# Patient Record
Sex: Female | Born: 1935 | Race: White | Hispanic: No | Smoking: Never smoker
Health system: Southern US, Community
[De-identification: ages and names within clinical notes are randomized; demographics above are authoritative.]

## PROBLEM LIST (undated history)

## (undated) DIAGNOSIS — N183 Chronic kidney disease, stage 3 unspecified: Secondary | ICD-10-CM

## (undated) DIAGNOSIS — K7290 Hepatic failure, unspecified without coma: Secondary | ICD-10-CM

## (undated) DIAGNOSIS — K7460 Unspecified cirrhosis of liver: Secondary | ICD-10-CM

## (undated) DIAGNOSIS — R188 Other ascites: Secondary | ICD-10-CM

## (undated) DIAGNOSIS — Z8719 Personal history of other diseases of the digestive system: Secondary | ICD-10-CM

## (undated) DIAGNOSIS — K7581 Nonalcoholic steatohepatitis (NASH): Secondary | ICD-10-CM

## (undated) DIAGNOSIS — K7682 Hepatic encephalopathy: Secondary | ICD-10-CM

## (undated) HISTORY — DX: Hepatic failure, unspecified without coma: K72.90

## (undated) HISTORY — DX: Chronic kidney disease, stage 3 (moderate): N18.3

## (undated) HISTORY — DX: Personal history of other diseases of the digestive system: Z87.19

## (undated) HISTORY — DX: Unspecified cirrhosis of liver: K74.60

## (undated) HISTORY — PX: ABDOMINAL HYSTERECTOMY: SHX81

## (undated) HISTORY — PX: CHOLECYSTECTOMY: SHX55

## (undated) HISTORY — PX: APPENDECTOMY: SHX54

## (undated) HISTORY — DX: Nonalcoholic steatohepatitis (NASH): K75.81

## (undated) HISTORY — PX: OTHER SURGICAL HISTORY: SHX169

## (undated) HISTORY — DX: Chronic kidney disease, stage 3 unspecified: N18.30

## (undated) HISTORY — DX: Other ascites: R18.8

## (undated) HISTORY — DX: Hepatic encephalopathy: K76.82

---

## 2017-12-01 ENCOUNTER — Encounter: Payer: Self-pay | Admitting: Gastroenterology

## 2018-01-02 ENCOUNTER — Encounter: Payer: Self-pay | Admitting: Gastroenterology

## 2018-01-03 ENCOUNTER — Ambulatory Visit (INDEPENDENT_AMBULATORY_CARE_PROVIDER_SITE_OTHER): Payer: Medicare HMO | Admitting: Gastroenterology

## 2018-01-03 ENCOUNTER — Encounter: Payer: Self-pay | Admitting: Gastroenterology

## 2018-01-03 VITALS — BP 124/68 | HR 76 | Ht 65.5 in | Wt 213.0 lb

## 2018-01-03 DIAGNOSIS — I10 Essential (primary) hypertension: Secondary | ICD-10-CM | POA: Diagnosis not present

## 2018-01-03 DIAGNOSIS — K7460 Unspecified cirrhosis of liver: Secondary | ICD-10-CM

## 2018-01-03 NOTE — Patient Instructions (Addendum)
You have been given your lab orders to take to your PCP to have drawn.  You have been given an order for your abdominal ultrasound to take to [place] will need to be on a strict 2 liters a day of fluids.  Please follow up with Dr. Lyndel Safe in 3 months.

## 2018-01-03 NOTE — Progress Notes (Signed)
Chief Complaint: Follow-up  Referring Provider:  Dr Garlon Hatchet      ASSESSMENT AND PLAN;   #1. 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  Patient does not a candidate for liver transplantation. #2.  Hepatic encephalopathy. #3.  Upper GI bleeding due to esophageal varices status post EVL 02/2014.  EGD 07/2014: Small esophageal varices-too small for aVL, portal hypertensive gastropathy, minimal fundal varices and Candida esophagitis. #4.  Liver lesions (4.2 cm in the right lobe, 1.5 cm in the left lobe with early washout on MRI 12/31/2016 annual ultrasound with normal alpha-fetoprotein.  These were highly suggestive of [place]).  Patient was seen at [facility] and it was recommended to get a repeat MRI in 3 months. Pt wants to hold off on MRI today.  Plan: - Fluid restriction and salt restriction.  Monitor weight. - Rifaxamin 550mg  po bid to continue. - Lasix 40mg  po bid to continue - Spiralolactone 50mg  po qd to continue. - Rpt US abdomen - I have offered repeat MRI but the patient would like to hold off.  She does understand the risks of missing or delaying diagnoses for [place] - Carvidol 12.5mg  bid to continue. - Check AFP, ammonia and PT. She would like to get it done at Dr. Arna Medici office. She was given written orders. - Follow-up in 3 months.  Earlier, if she has any new problems.  HPI:    Katie Copeland is a 84 y.o. female  For follow-up visit. Denies having any significant GI complaints. Has gained over 8 pounds since the last visit. Has not been compliant with fluid restriction.  She has been consuming significant ice as her mouth becomes dry. Had some swelling in the legs. Does take medications as prescribed. She called Dr. Crisoforo Oxford office.  They told her to follow-up here. No  shortness of breath No melena or hematochezia.  Does not want repeat MRI of the liver as " I am still paying for the previous MRI and it was expensive".   Past Medical History:  Diagnosis Date  . Ascites   . CKD (chronic kidney disease) stage 3, GFR 30-59 ml/min (HCC)   . Hepatic encephalopathy ([place])   . History of GI bleed   . Liver cirrhosis secondary to NASH [facility])     Past Surgical History:  Procedure Laterality Date  . ABDOMINAL HYSTERECTOMY    . APPENDECTOMY    . Catheter ablation    . CHOLECYSTECTOMY    . s/p ganglion cyst removal    . s/p heel spur removal      Family History  Problem Relation Age of Onset  . Breast cancer Mother   . Colon cancer Neg Hx     Social History   Tobacco Use  . Smoking status: Never Smoker  . Smokeless tobacco: Never Used  Substance Use Topics  . Alcohol use: Never    Frequency: Never  . Drug use: Never    Current Outpatient Medications  Medication Sig Dispense Refill  . carvedilol (COREG) 12.5 MG tablet Take 12.5 mg by mouth 2 (two) times daily with a meal.    . furosemide (LASIX) 20 MG tablet Take 20 mg by mouth 2 (two) times daily.    Marland Kitchen glimepiride (AMARYL) 2 MG tablet Take 2 mg by mouth daily with breakfast.    . rifaximin (XIFAXAN) 550 MG TABS  tablet Take 550 mg by mouth 2 (two) times daily.    Marland Kitchen spironolactone (ALDACTONE) 50 MG tablet Take 50 mg by mouth daily.     No current facility-administered medications for this visit.     Allergies  Allergen Reactions  . Codeine     Neurologic changes    Review of Systems:   Review of Systems: General: Denies any fever, chills, night sweats, anorexia.  Has fatigue and generalized weakness. Skin: Denies rash, itching, dry skin, hives, moles, warts, or unhealing ulcers.  HEENT: Not present - nasal congestion, sinus pain, hoarseness and sore throat. Respiratory: Denies dyspnea at rest, dyspnea with exercise, cough, sputum, wheezing, coughing up blood. Cardiovascular: Denies  any chest pain, angina, palpitations, syncope, orthopnea, PND, peripheral edema, and claudication. GU : Denies urinary burning, blood in urine, urinary frequency, urinary hesitancy, nocturnal urination, and urinary incontinence. Musculoskeletal: denies joint pain, extremity pain. Denies muscle weakness, cramps, atrophy.  Neurological:  Denies any headaches, dizziness, paresthesias and seizures. Psych: Has anxiety and depression. Heme: Has easy bruisability.     Physical Exam:    BP 124/68   Pulse 76   Ht 5' 5.5" (1.664 m)   Wt 213 lb (96.6 kg)   BMI 34.91 kg/m  Filed Weights   01/03/18 1117  Weight: 213 lb (96.6 kg)   Constitutional:  Well-developed, in no acute distress. Psychiatric: Normal mood and affect. Behavior is normal. HEENT: Pupils normal.  Conjunctivae are normal. No scleral icterus. Neck supple.  Cardiovascular: Normal rate, regular rhythm. No edema Pulmonary/chest: Effort normal and breath sounds normal. No wheezing, rales or rhonchi. Abdominal: Soft, nondistended. Nontender. Bowel sounds active throughout. There are no masses palpable. No hepatomegaly. Rectal:  defered Neurological: Alert and oriented to person place and time. Skin: Skin is warm and dry. No rashes noted. Extremities: 2+ edema.    Carmell Austria, MD 01/03/2018, 11:36 AM  Cc: Dr. Garlon Hatchet

## 2018-02-27 ENCOUNTER — Telehealth: Payer: Self-pay

## 2018-02-27 DIAGNOSIS — K7460 Unspecified cirrhosis of liver: Secondary | ICD-10-CM

## 2018-02-27 NOTE — Telephone Encounter (Signed)
Patient scheduled for Ultrasound of the Abdomen at [facility] on August 26 th at [facility] . Patient notified by phone.

## 2018-03-06 ENCOUNTER — Other Ambulatory Visit (HOSPITAL_BASED_OUTPATIENT_CLINIC_OR_DEPARTMENT_OTHER): Payer: Medicare HMO

## 2018-03-06 ENCOUNTER — Ambulatory Visit (HOSPITAL_BASED_OUTPATIENT_CLINIC_OR_DEPARTMENT_OTHER)
Admission: RE | Admit: 2018-03-06 | Discharge: 2018-03-06 | Disposition: A | Discharge status: Home / Self Care | Payer: Medicare HMO | Source: Ambulatory Visit | Attending: Gastroenterology | Admitting: Gastroenterology

## 2018-03-06 ENCOUNTER — Other Ambulatory Visit (INDEPENDENT_AMBULATORY_CARE_PROVIDER_SITE_OTHER): Payer: Medicare HMO

## 2018-03-06 DIAGNOSIS — I10 Essential (primary) hypertension: Secondary | ICD-10-CM

## 2018-03-06 DIAGNOSIS — K7460 Unspecified cirrhosis of liver: Secondary | ICD-10-CM | POA: Diagnosis present

## 2018-03-06 LAB — PROTIME-INR
INR: 1.3 ratio — AB (ref 0.8–1.0)
PROTHROMBIN TIME: 15.2 s — AB (ref 9.6–13.1)

## 2018-03-06 LAB — AMMONIA: Ammonia: 76 umol/L — ABNORMAL HIGH (ref 11–35)

## 2018-03-07 ENCOUNTER — Telehealth: Payer: Self-pay | Admitting: Gastroenterology

## 2018-03-07 ENCOUNTER — Telehealth: Payer: Self-pay

## 2018-03-07 LAB — AFP TUMOR MARKER: AFP-Tumor Marker: 4.5 ng/mL

## 2018-03-07 NOTE — Telephone Encounter (Signed)
I spoke with the patient and gave her the results of the ultrasound.  She did have an AFP done and the result is on the chart.  Patient states she recently had a MRI done at [facility] and I will try to get that report.  She would like to speak with you when you are back in the office next week

## 2018-03-07 NOTE — Telephone Encounter (Signed)
I received a copy of the MRI from [facility] and it was sent to [facility] by courier

## 2018-03-09 ENCOUNTER — Telehealth: Payer: Self-pay | Admitting: Gastroenterology

## 2018-03-09 NOTE — Telephone Encounter (Signed)
Ultrasound and labs were routed to her PCP.

## 2018-03-16 DIAGNOSIS — R112 Nausea with vomiting, unspecified: Secondary | ICD-10-CM

## 2018-03-16 DIAGNOSIS — D696 Thrombocytopenia, unspecified: Secondary | ICD-10-CM

## 2018-03-16 DIAGNOSIS — K7460 Unspecified cirrhosis of liver: Secondary | ICD-10-CM

## 2018-03-16 DIAGNOSIS — N390 Urinary tract infection, site not specified: Secondary | ICD-10-CM | POA: Diagnosis not present

## 2018-03-16 DIAGNOSIS — Q453 Other congenital malformations of pancreas and pancreatic duct: Secondary | ICD-10-CM

## 2018-03-16 DIAGNOSIS — I81 Portal vein thrombosis: Secondary | ICD-10-CM

## 2018-03-16 DIAGNOSIS — R160 Hepatomegaly, not elsewhere classified: Secondary | ICD-10-CM | POA: Diagnosis not present

## 2018-03-16 DIAGNOSIS — I10 Essential (primary) hypertension: Secondary | ICD-10-CM

## 2018-03-17 DIAGNOSIS — K7460 Unspecified cirrhosis of liver: Secondary | ICD-10-CM

## 2018-03-17 DIAGNOSIS — D689 Coagulation defect, unspecified: Secondary | ICD-10-CM

## 2018-03-17 DIAGNOSIS — N189 Chronic kidney disease, unspecified: Secondary | ICD-10-CM

## 2018-03-17 DIAGNOSIS — N390 Urinary tract infection, site not specified: Secondary | ICD-10-CM

## 2018-03-17 DIAGNOSIS — C220 Liver cell carcinoma: Secondary | ICD-10-CM

## 2018-03-17 DIAGNOSIS — I81 Portal vein thrombosis: Secondary | ICD-10-CM | POA: Diagnosis not present

## 2018-03-17 DIAGNOSIS — D696 Thrombocytopenia, unspecified: Secondary | ICD-10-CM

## 2018-03-17 DIAGNOSIS — D6489 Other specified anemias: Secondary | ICD-10-CM | POA: Diagnosis not present

## 2018-03-17 DIAGNOSIS — R160 Hepatomegaly, not elsewhere classified: Secondary | ICD-10-CM | POA: Diagnosis not present

## 2018-03-17 DIAGNOSIS — R161 Splenomegaly, not elsewhere classified: Secondary | ICD-10-CM

## 2018-03-18 DIAGNOSIS — N390 Urinary tract infection, site not specified: Secondary | ICD-10-CM | POA: Diagnosis not present

## 2018-03-18 DIAGNOSIS — I81 Portal vein thrombosis: Secondary | ICD-10-CM | POA: Diagnosis not present

## 2018-03-18 DIAGNOSIS — R160 Hepatomegaly, not elsewhere classified: Secondary | ICD-10-CM | POA: Diagnosis not present

## 2018-03-18 DIAGNOSIS — D696 Thrombocytopenia, unspecified: Secondary | ICD-10-CM | POA: Diagnosis not present

## 2018-03-20 ENCOUNTER — Telehealth: Payer: Self-pay | Admitting: Gastroenterology

## 2018-03-20 NOTE — Telephone Encounter (Signed)
Pt returning phone call to Dr.Gupta best call back [phone].

## 2018-03-20 NOTE — Telephone Encounter (Signed)
Called again Left message on the voicemail

## 2018-03-20 NOTE — Telephone Encounter (Signed)
I talked to the patient in detail Had an MRI done at [facility] a few days ago. Also has appointment with Dr. Hinton Rao for September 10 at [place], can you please get the MRI report from [facility]

## 2018-03-21 NOTE — Telephone Encounter (Signed)
Called and requested MRI report be faxed to [phone], also faxed request to [phone].

## 2018-03-21 NOTE — Telephone Encounter (Signed)
MRI report faxed to Dr. Lyndel Safe.

## 2018-03-22 DIAGNOSIS — I81 Portal vein thrombosis: Secondary | ICD-10-CM

## 2018-03-22 DIAGNOSIS — K7460 Unspecified cirrhosis of liver: Secondary | ICD-10-CM

## 2018-03-22 DIAGNOSIS — C220 Liver cell carcinoma: Secondary | ICD-10-CM | POA: Diagnosis not present

## 2018-03-22 DIAGNOSIS — R161 Splenomegaly, not elsewhere classified: Secondary | ICD-10-CM

## 2018-03-22 DIAGNOSIS — R188 Other ascites: Secondary | ICD-10-CM

## 2018-03-22 DIAGNOSIS — E876 Hypokalemia: Secondary | ICD-10-CM

## 2018-03-22 DIAGNOSIS — I8500 Esophageal varices without bleeding: Secondary | ICD-10-CM

## 2018-03-22 DIAGNOSIS — D638 Anemia in other chronic diseases classified elsewhere: Secondary | ICD-10-CM

## 2018-03-22 DIAGNOSIS — R160 Hepatomegaly, not elsewhere classified: Secondary | ICD-10-CM

## 2018-03-22 DIAGNOSIS — D6959 Other secondary thrombocytopenia: Secondary | ICD-10-CM

## 2018-05-07 DIAGNOSIS — J811 Chronic pulmonary edema: Secondary | ICD-10-CM | POA: Diagnosis not present

## 2018-05-07 DIAGNOSIS — J9601 Acute respiratory failure with hypoxia: Secondary | ICD-10-CM

## 2018-05-07 DIAGNOSIS — R0600 Dyspnea, unspecified: Secondary | ICD-10-CM | POA: Diagnosis not present

## 2018-05-08 DIAGNOSIS — J811 Chronic pulmonary edema: Secondary | ICD-10-CM | POA: Diagnosis not present

## 2018-05-08 DIAGNOSIS — I340 Nonrheumatic mitral (valve) insufficiency: Secondary | ICD-10-CM | POA: Diagnosis not present

## 2018-05-08 DIAGNOSIS — R0600 Dyspnea, unspecified: Secondary | ICD-10-CM | POA: Diagnosis not present

## 2018-05-08 DIAGNOSIS — J9601 Acute respiratory failure with hypoxia: Secondary | ICD-10-CM | POA: Diagnosis not present

## 2018-06-14 DIAGNOSIS — E871 Hypo-osmolality and hyponatremia: Secondary | ICD-10-CM | POA: Diagnosis not present

## 2018-06-14 DIAGNOSIS — D6959 Other secondary thrombocytopenia: Secondary | ICD-10-CM

## 2018-06-14 DIAGNOSIS — K7460 Unspecified cirrhosis of liver: Secondary | ICD-10-CM

## 2018-06-14 DIAGNOSIS — I81 Portal vein thrombosis: Secondary | ICD-10-CM

## 2018-06-14 DIAGNOSIS — E8809 Other disorders of plasma-protein metabolism, not elsewhere classified: Secondary | ICD-10-CM

## 2018-06-14 DIAGNOSIS — C220 Liver cell carcinoma: Secondary | ICD-10-CM

## 2018-06-14 DIAGNOSIS — Z79899 Other long term (current) drug therapy: Secondary | ICD-10-CM | POA: Diagnosis not present

## 2018-06-14 DIAGNOSIS — D649 Anemia, unspecified: Secondary | ICD-10-CM

## 2018-06-15 DIAGNOSIS — F0390 Unspecified dementia without behavioral disturbance: Secondary | ICD-10-CM

## 2018-06-15 DIAGNOSIS — J811 Chronic pulmonary edema: Secondary | ICD-10-CM

## 2018-06-15 DIAGNOSIS — K7460 Unspecified cirrhosis of liver: Secondary | ICD-10-CM

## 2018-06-15 DIAGNOSIS — I5032 Chronic diastolic (congestive) heart failure: Secondary | ICD-10-CM

## 2018-06-15 DIAGNOSIS — R0603 Acute respiratory distress: Secondary | ICD-10-CM | POA: Diagnosis not present

## 2018-06-15 DIAGNOSIS — D61818 Other pancytopenia: Secondary | ICD-10-CM

## 2018-06-15 DIAGNOSIS — D696 Thrombocytopenia, unspecified: Secondary | ICD-10-CM

## 2018-06-15 DIAGNOSIS — I81 Portal vein thrombosis: Secondary | ICD-10-CM

## 2018-06-15 DIAGNOSIS — K7581 Nonalcoholic steatohepatitis (NASH): Secondary | ICD-10-CM | POA: Diagnosis not present

## 2018-06-15 DIAGNOSIS — I10 Essential (primary) hypertension: Secondary | ICD-10-CM

## 2018-06-15 DIAGNOSIS — C220 Liver cell carcinoma: Secondary | ICD-10-CM | POA: Diagnosis not present

## 2018-06-15 DIAGNOSIS — Z7901 Long term (current) use of anticoagulants: Secondary | ICD-10-CM

## 2018-06-15 DIAGNOSIS — J90 Pleural effusion, not elsewhere classified: Secondary | ICD-10-CM

## 2018-06-15 DIAGNOSIS — E119 Type 2 diabetes mellitus without complications: Secondary | ICD-10-CM

## 2018-06-16 DIAGNOSIS — J811 Chronic pulmonary edema: Secondary | ICD-10-CM | POA: Diagnosis not present

## 2018-06-16 DIAGNOSIS — R0603 Acute respiratory distress: Secondary | ICD-10-CM | POA: Diagnosis not present

## 2018-06-16 DIAGNOSIS — D696 Thrombocytopenia, unspecified: Secondary | ICD-10-CM | POA: Diagnosis not present

## 2018-06-16 DIAGNOSIS — J90 Pleural effusion, not elsewhere classified: Secondary | ICD-10-CM | POA: Diagnosis not present

## 2018-06-16 DIAGNOSIS — K7460 Unspecified cirrhosis of liver: Secondary | ICD-10-CM | POA: Diagnosis not present

## 2018-06-16 DIAGNOSIS — K7581 Nonalcoholic steatohepatitis (NASH): Secondary | ICD-10-CM | POA: Diagnosis not present

## 2018-06-16 DIAGNOSIS — C220 Liver cell carcinoma: Secondary | ICD-10-CM | POA: Diagnosis not present

## 2018-06-28 DIAGNOSIS — I509 Heart failure, unspecified: Secondary | ICD-10-CM

## 2018-06-28 DIAGNOSIS — Z86718 Personal history of other venous thrombosis and embolism: Secondary | ICD-10-CM

## 2018-06-28 DIAGNOSIS — E871 Hypo-osmolality and hyponatremia: Secondary | ICD-10-CM

## 2018-06-28 DIAGNOSIS — D649 Anemia, unspecified: Secondary | ICD-10-CM

## 2018-06-28 DIAGNOSIS — D6959 Other secondary thrombocytopenia: Secondary | ICD-10-CM

## 2018-06-28 DIAGNOSIS — K7460 Unspecified cirrhosis of liver: Secondary | ICD-10-CM

## 2018-06-28 DIAGNOSIS — C220 Liver cell carcinoma: Secondary | ICD-10-CM

## 2018-08-23 DIAGNOSIS — C220 Liver cell carcinoma: Secondary | ICD-10-CM

## 2018-10-02 ENCOUNTER — Other Ambulatory Visit: Payer: Self-pay | Admitting: Gastroenterology

## 2018-10-02 MED ORDER — RIFAXIMIN 550 MG PO TABS: 550.0000 mg | ORAL_TABLET | Freq: Two times a day (BID) | ORAL | 6 refills | Status: AC

## 2018-10-02 NOTE — Telephone Encounter (Signed)
Pt needs rf for xifaxan sent to her pharmacy.

## 2018-10-02 NOTE — Telephone Encounter (Signed)
Sent refill to patients pharmacy. 

## 2018-10-18 DIAGNOSIS — C220 Liver cell carcinoma: Secondary | ICD-10-CM | POA: Diagnosis not present

## 2018-10-19 NOTE — Addendum Note (Signed)
Addended by: Oda Kilts on: 10/19/2018 02:34 PM   Modules accepted: Orders

## 2018-10-19 NOTE — Telephone Encounter (Signed)
No Answer  Called pt to verify pharmacy

## 2018-10-19 NOTE — Telephone Encounter (Signed)
Patient states she has still not received medication xifaxan and would like to know if it can be sent again or if she can get an update.

## 2018-11-15 DIAGNOSIS — C220 Liver cell carcinoma: Secondary | ICD-10-CM | POA: Diagnosis not present

## 2018-11-19 DIAGNOSIS — E8809 Other disorders of plasma-protein metabolism, not elsewhere classified: Secondary | ICD-10-CM

## 2018-11-19 DIAGNOSIS — R7989 Other specified abnormal findings of blood chemistry: Secondary | ICD-10-CM | POA: Diagnosis not present

## 2018-11-19 DIAGNOSIS — Z8505 Personal history of malignant neoplasm of liver: Secondary | ICD-10-CM

## 2018-11-19 DIAGNOSIS — J90 Pleural effusion, not elsewhere classified: Secondary | ICD-10-CM | POA: Diagnosis not present

## 2018-11-19 DIAGNOSIS — R0603 Acute respiratory distress: Secondary | ICD-10-CM | POA: Diagnosis not present

## 2018-11-19 DIAGNOSIS — R0902 Hypoxemia: Secondary | ICD-10-CM | POA: Diagnosis not present

## 2018-11-20 DIAGNOSIS — R0603 Acute respiratory distress: Secondary | ICD-10-CM | POA: Diagnosis not present

## 2018-11-20 DIAGNOSIS — R7989 Other specified abnormal findings of blood chemistry: Secondary | ICD-10-CM | POA: Diagnosis not present

## 2018-11-20 DIAGNOSIS — R0902 Hypoxemia: Secondary | ICD-10-CM | POA: Diagnosis not present

## 2018-11-20 DIAGNOSIS — J90 Pleural effusion, not elsewhere classified: Secondary | ICD-10-CM | POA: Diagnosis not present

## 2018-11-23 DIAGNOSIS — N179 Acute kidney failure, unspecified: Secondary | ICD-10-CM

## 2018-11-23 DIAGNOSIS — E1165 Type 2 diabetes mellitus with hyperglycemia: Secondary | ICD-10-CM

## 2018-11-23 DIAGNOSIS — E8779 Other fluid overload: Secondary | ICD-10-CM | POA: Diagnosis not present

## 2018-11-23 DIAGNOSIS — C220 Liver cell carcinoma: Secondary | ICD-10-CM | POA: Diagnosis not present

## 2018-11-23 DIAGNOSIS — E119 Type 2 diabetes mellitus without complications: Secondary | ICD-10-CM

## 2018-11-23 DIAGNOSIS — D649 Anemia, unspecified: Secondary | ICD-10-CM

## 2018-11-23 DIAGNOSIS — D696 Thrombocytopenia, unspecified: Secondary | ICD-10-CM

## 2018-11-23 DIAGNOSIS — J90 Pleural effusion, not elsewhere classified: Secondary | ICD-10-CM | POA: Diagnosis not present

## 2018-11-23 DIAGNOSIS — R0602 Shortness of breath: Secondary | ICD-10-CM | POA: Diagnosis not present

## 2018-11-23 DIAGNOSIS — I340 Nonrheumatic mitral (valve) insufficiency: Secondary | ICD-10-CM | POA: Diagnosis not present

## 2018-11-23 DIAGNOSIS — I10 Essential (primary) hypertension: Secondary | ICD-10-CM

## 2018-11-24 DIAGNOSIS — E8779 Other fluid overload: Secondary | ICD-10-CM | POA: Diagnosis not present

## 2018-11-24 DIAGNOSIS — E1165 Type 2 diabetes mellitus with hyperglycemia: Secondary | ICD-10-CM | POA: Diagnosis not present

## 2018-11-24 DIAGNOSIS — C220 Liver cell carcinoma: Secondary | ICD-10-CM

## 2018-11-24 DIAGNOSIS — D649 Anemia, unspecified: Secondary | ICD-10-CM | POA: Diagnosis not present

## 2018-11-24 DIAGNOSIS — N179 Acute kidney failure, unspecified: Secondary | ICD-10-CM | POA: Diagnosis not present

## 2018-11-25 DIAGNOSIS — C220 Liver cell carcinoma: Secondary | ICD-10-CM

## 2018-11-25 DIAGNOSIS — E1165 Type 2 diabetes mellitus with hyperglycemia: Secondary | ICD-10-CM | POA: Diagnosis not present

## 2018-11-25 DIAGNOSIS — D649 Anemia, unspecified: Secondary | ICD-10-CM | POA: Diagnosis not present

## 2018-11-25 DIAGNOSIS — N179 Acute kidney failure, unspecified: Secondary | ICD-10-CM | POA: Diagnosis not present

## 2018-11-27 DIAGNOSIS — R531 Weakness: Secondary | ICD-10-CM | POA: Diagnosis not present

## 2018-11-27 DIAGNOSIS — J9601 Acute respiratory failure with hypoxia: Secondary | ICD-10-CM | POA: Diagnosis not present

## 2018-11-27 DIAGNOSIS — J90 Pleural effusion, not elsewhere classified: Secondary | ICD-10-CM

## 2018-11-27 DIAGNOSIS — W19XXXA Unspecified fall, initial encounter: Secondary | ICD-10-CM | POA: Diagnosis not present

## 2018-11-27 DIAGNOSIS — C220 Liver cell carcinoma: Secondary | ICD-10-CM

## 2018-11-28 DIAGNOSIS — R531 Weakness: Secondary | ICD-10-CM | POA: Diagnosis not present

## 2018-11-28 DIAGNOSIS — J90 Pleural effusion, not elsewhere classified: Secondary | ICD-10-CM

## 2018-11-28 DIAGNOSIS — W19XXXA Unspecified fall, initial encounter: Secondary | ICD-10-CM | POA: Diagnosis not present

## 2018-11-28 DIAGNOSIS — J9601 Acute respiratory failure with hypoxia: Secondary | ICD-10-CM | POA: Diagnosis not present

## 2018-11-28 DIAGNOSIS — C220 Liver cell carcinoma: Secondary | ICD-10-CM

## 2018-11-29 ENCOUNTER — Telehealth: Payer: Self-pay | Admitting: Gastroenterology

## 2018-11-29 DIAGNOSIS — C220 Liver cell carcinoma: Secondary | ICD-10-CM

## 2018-11-29 DIAGNOSIS — W19XXXA Unspecified fall, initial encounter: Secondary | ICD-10-CM | POA: Diagnosis not present

## 2018-11-29 DIAGNOSIS — J9601 Acute respiratory failure with hypoxia: Secondary | ICD-10-CM | POA: Diagnosis not present

## 2018-11-29 DIAGNOSIS — J90 Pleural effusion, not elsewhere classified: Secondary | ICD-10-CM | POA: Diagnosis not present

## 2018-11-29 DIAGNOSIS — R531 Weakness: Secondary | ICD-10-CM | POA: Diagnosis not present

## 2018-11-30 DIAGNOSIS — C220 Liver cell carcinoma: Secondary | ICD-10-CM

## 2018-11-30 DIAGNOSIS — J90 Pleural effusion, not elsewhere classified: Secondary | ICD-10-CM | POA: Diagnosis not present

## 2018-11-30 DIAGNOSIS — J9601 Acute respiratory failure with hypoxia: Secondary | ICD-10-CM | POA: Diagnosis not present

## 2018-11-30 DIAGNOSIS — N390 Urinary tract infection, site not specified: Secondary | ICD-10-CM

## 2018-11-30 DIAGNOSIS — R531 Weakness: Secondary | ICD-10-CM | POA: Diagnosis not present

## 2018-11-30 DIAGNOSIS — W19XXXA Unspecified fall, initial encounter: Secondary | ICD-10-CM | POA: Diagnosis not present

## 2018-11-30 NOTE — Telephone Encounter (Signed)
Have discussed with Dr. Hinton Rao

## 2018-12-01 DIAGNOSIS — R531 Weakness: Secondary | ICD-10-CM | POA: Diagnosis not present

## 2018-12-01 DIAGNOSIS — J9601 Acute respiratory failure with hypoxia: Secondary | ICD-10-CM | POA: Diagnosis not present

## 2018-12-01 DIAGNOSIS — W19XXXA Unspecified fall, initial encounter: Secondary | ICD-10-CM | POA: Diagnosis not present

## 2018-12-01 DIAGNOSIS — C220 Liver cell carcinoma: Secondary | ICD-10-CM | POA: Diagnosis not present

## 2018-12-01 DIAGNOSIS — J90 Pleural effusion, not elsewhere classified: Secondary | ICD-10-CM | POA: Diagnosis not present

## 2018-12-01 DIAGNOSIS — D649 Anemia, unspecified: Secondary | ICD-10-CM | POA: Diagnosis not present

## 2018-12-02 DIAGNOSIS — J9601 Acute respiratory failure with hypoxia: Secondary | ICD-10-CM | POA: Diagnosis not present

## 2018-12-02 DIAGNOSIS — J90 Pleural effusion, not elsewhere classified: Secondary | ICD-10-CM | POA: Diagnosis not present

## 2018-12-02 DIAGNOSIS — R531 Weakness: Secondary | ICD-10-CM | POA: Diagnosis not present

## 2018-12-02 DIAGNOSIS — W19XXXA Unspecified fall, initial encounter: Secondary | ICD-10-CM | POA: Diagnosis not present

## 2018-12-03 DIAGNOSIS — W19XXXA Unspecified fall, initial encounter: Secondary | ICD-10-CM | POA: Diagnosis not present

## 2018-12-03 DIAGNOSIS — J90 Pleural effusion, not elsewhere classified: Secondary | ICD-10-CM | POA: Diagnosis not present

## 2018-12-03 DIAGNOSIS — R531 Weakness: Secondary | ICD-10-CM | POA: Diagnosis not present

## 2018-12-03 DIAGNOSIS — J9601 Acute respiratory failure with hypoxia: Secondary | ICD-10-CM | POA: Diagnosis not present

## 2018-12-04 DIAGNOSIS — J9601 Acute respiratory failure with hypoxia: Secondary | ICD-10-CM | POA: Diagnosis not present

## 2018-12-04 DIAGNOSIS — W19XXXA Unspecified fall, initial encounter: Secondary | ICD-10-CM | POA: Diagnosis not present

## 2018-12-04 DIAGNOSIS — R531 Weakness: Secondary | ICD-10-CM | POA: Diagnosis not present

## 2018-12-04 DIAGNOSIS — J90 Pleural effusion, not elsewhere classified: Secondary | ICD-10-CM | POA: Diagnosis not present

## 2018-12-05 DIAGNOSIS — J90 Pleural effusion, not elsewhere classified: Secondary | ICD-10-CM | POA: Diagnosis not present

## 2018-12-05 DIAGNOSIS — R531 Weakness: Secondary | ICD-10-CM | POA: Diagnosis not present

## 2018-12-05 DIAGNOSIS — J9601 Acute respiratory failure with hypoxia: Secondary | ICD-10-CM | POA: Diagnosis not present

## 2018-12-05 DIAGNOSIS — W19XXXA Unspecified fall, initial encounter: Secondary | ICD-10-CM | POA: Diagnosis not present

## 2018-12-11 DEATH — deceased

## 2019-11-17 IMAGING — US US ABDOMEN COMPLETE
1 series · 13 of 25 positions shown · non-contrast
Comparison: Ultrasound December 27, 2016.  MRI of December 31, 2016.

CLINICAL DATA: Hepatic cirrhosis.

EXAM:
ABDOMEN ULTRASOUND COMPLETE

[Series 1: us abdomen complete · 0.25mm/px · 13 of 118 slices shown]
[im 1/118]
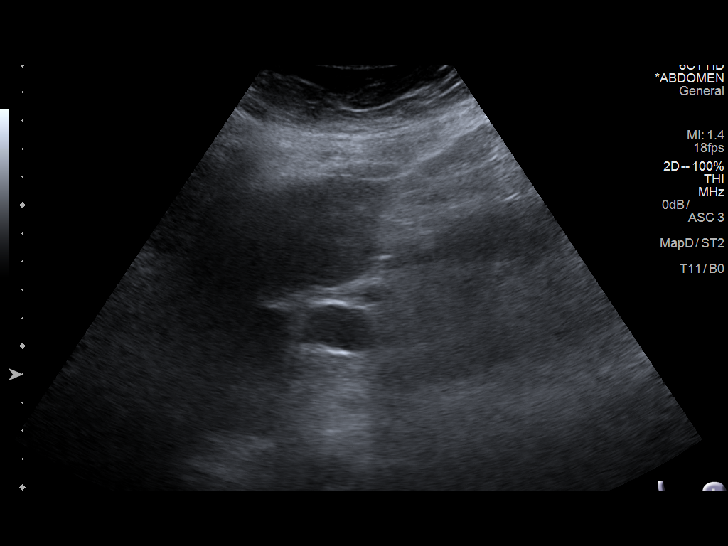
[im 10/118]
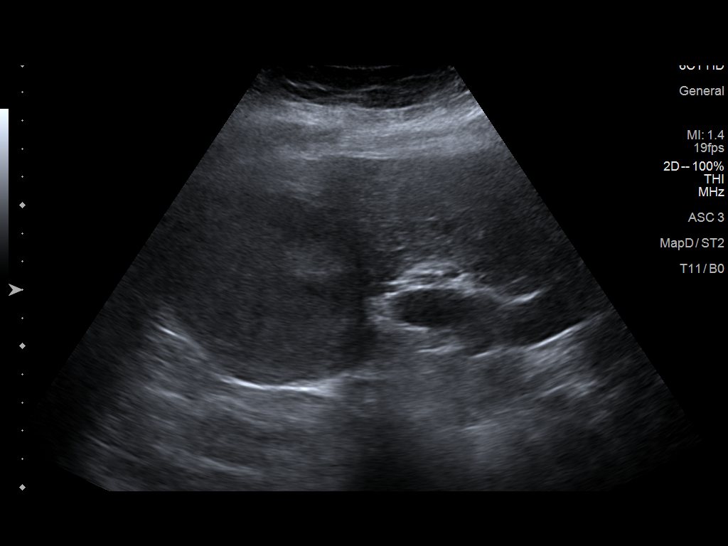
[im 20/118]
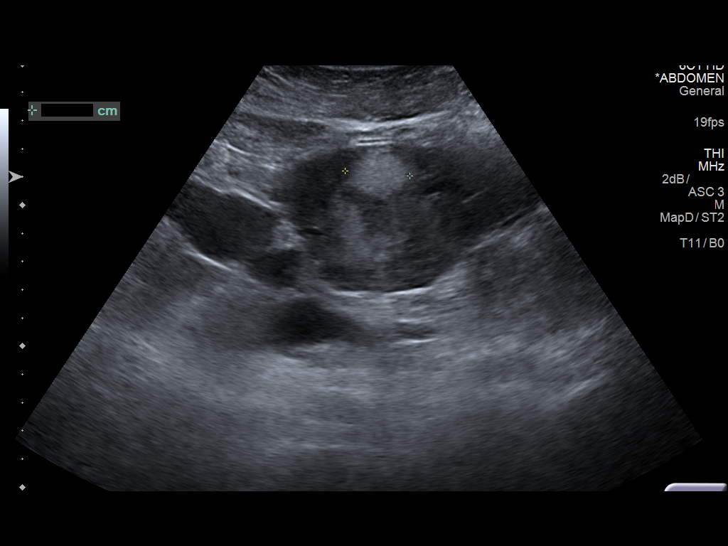
[im 30/118]
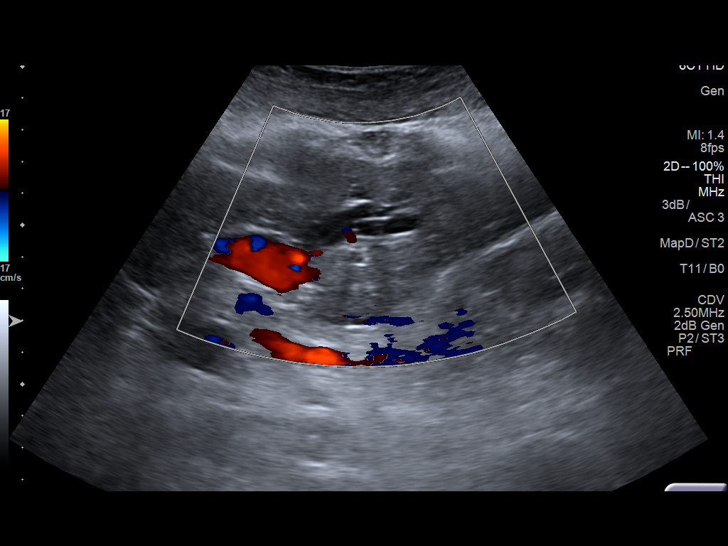
[im 40/118]
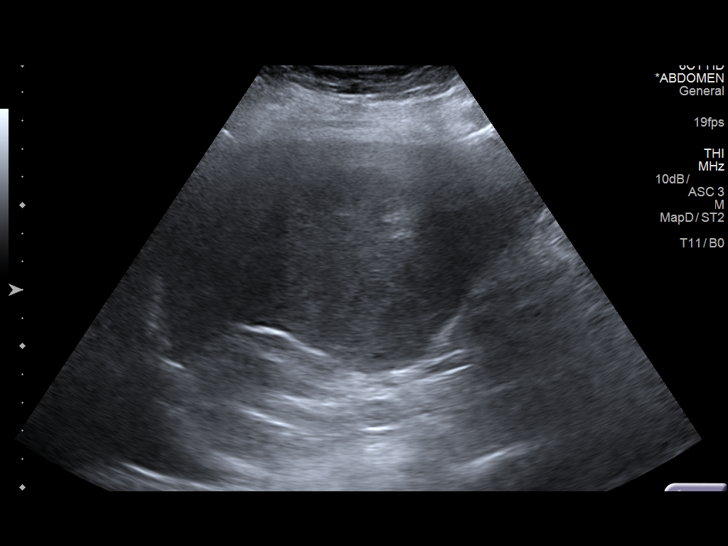
[im 49/118]
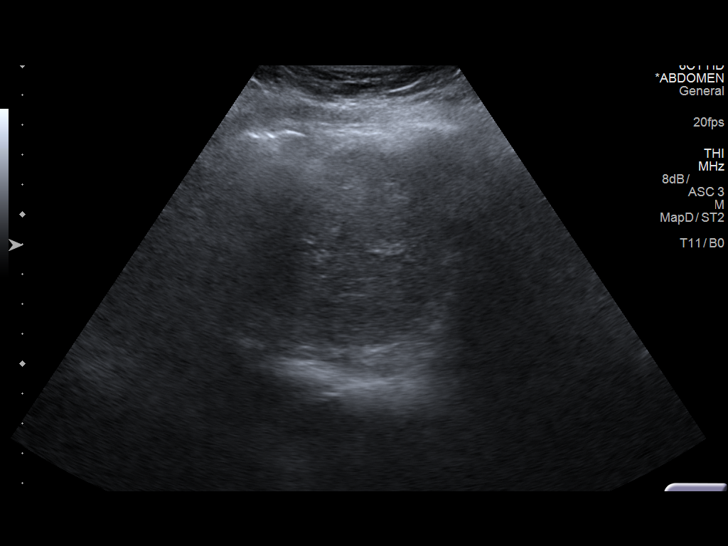
[im 59/118]
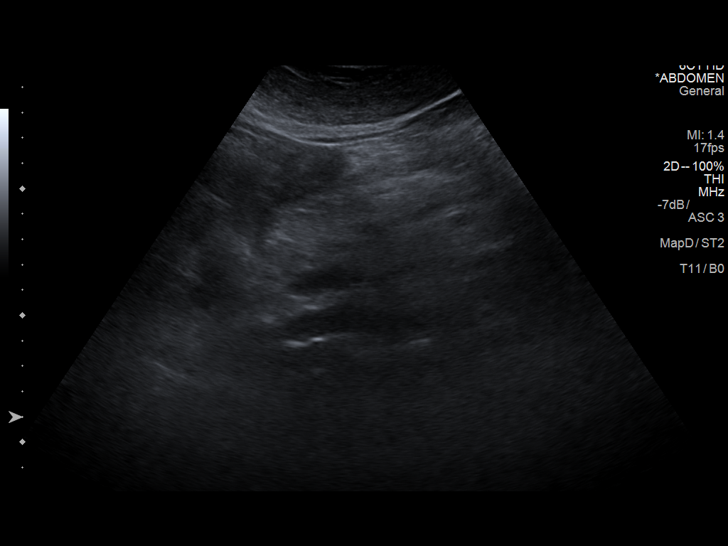
[im 69/118]
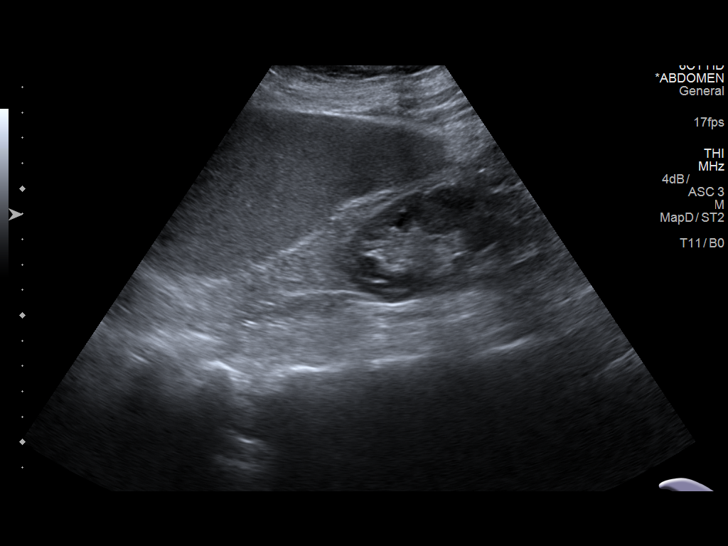
[im 79/118]
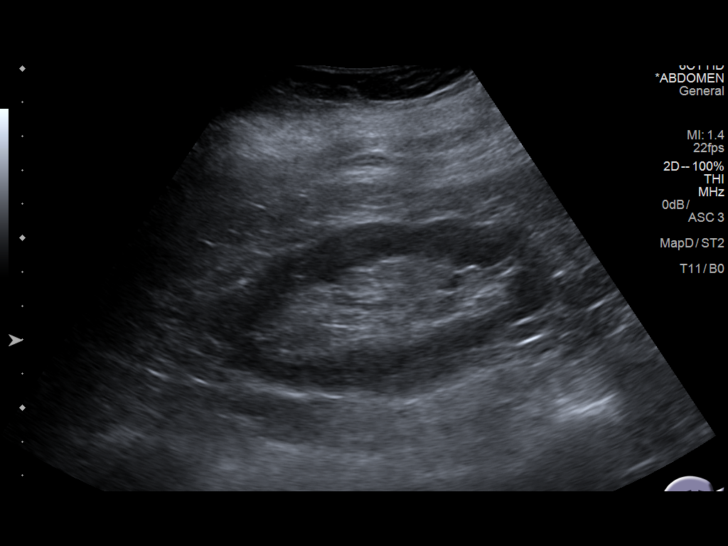
[im 88/118]
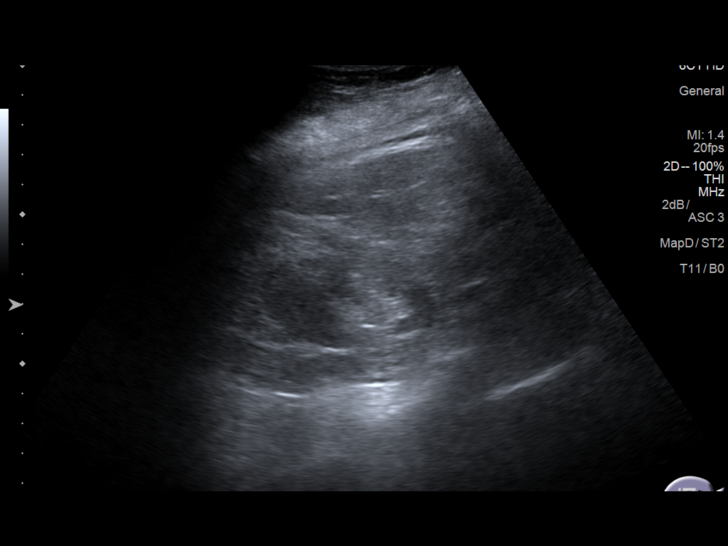
[im 98/118]
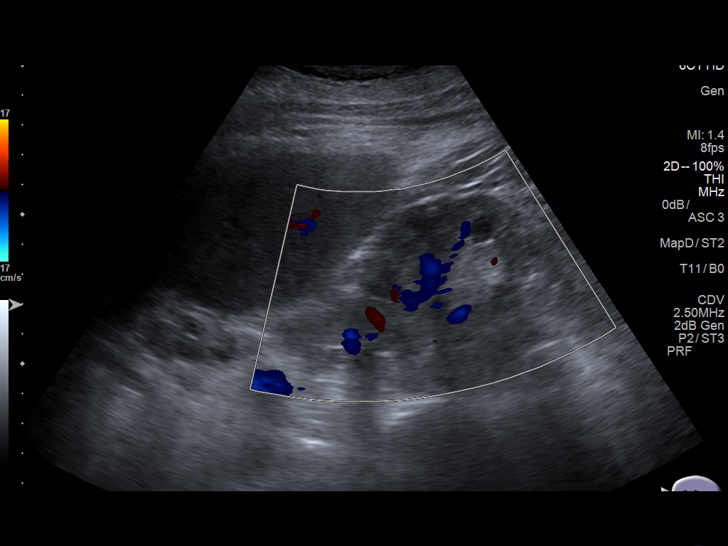
[im 108/118]
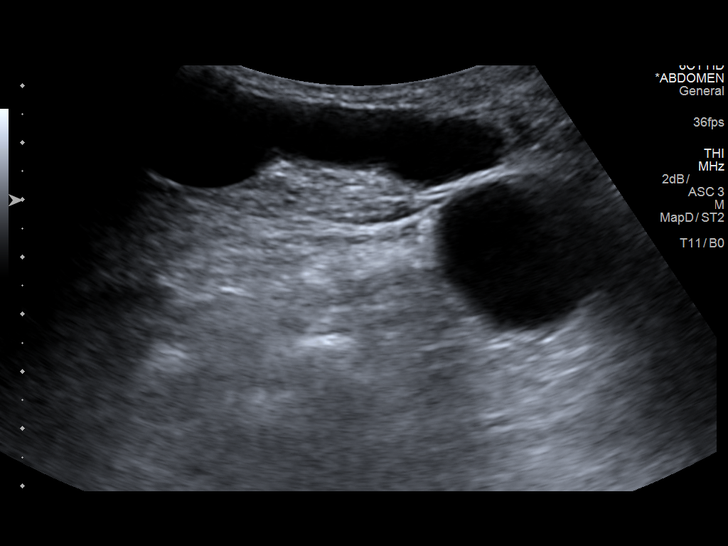
[im 118/118]
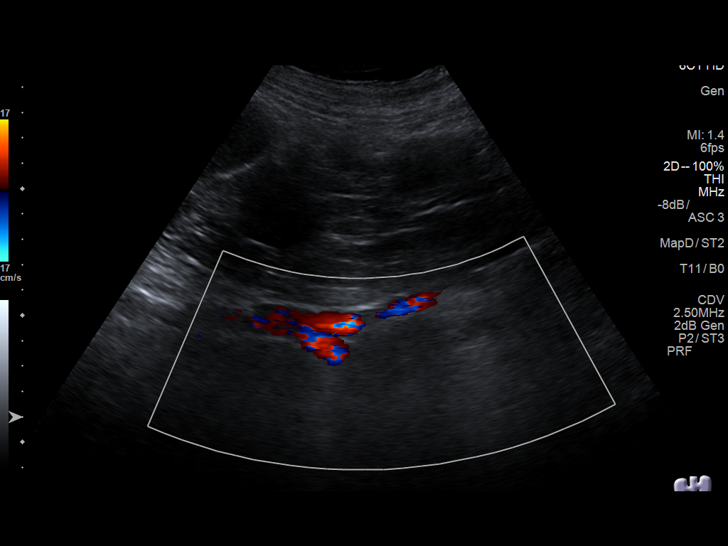

[13 of 25 positions shown; findings below may reference images not displayed]

FINDINGS: Gallbladder: Status post cholecystectomy.

Common bile duct: Diameter: 6 mm which is within normal limits.

Liver: Multiple lesions are noted in the hepatic parenchyma which
includes 4.1 cm lesion in left hepatic lobe which is increased in
size compared to prior exam. 7.3 cm solid lesion is seen in right
hepatic lobe which may be slightly enlarged compared to prior exam..
Within normal limits in parenchymal echogenicity. Portal vein is
patent on color Doppler imaging with normal direction of blood flow
towards the liver.

IVC: No abnormality visualized.

Pancreas: Visualized portion unremarkable.

Spleen: Measures 18.6 cm in maximum diameter with calculated volume
of 6152 cubic cm consistent with severe splenomegaly.

Right Kidney: Length: 10.5 cm. Echogenicity within normal limits. No
mass or hydronephrosis visualized.

Left Kidney: Length: 10.5 cm. Echogenicity within normal limits. No
mass or hydronephrosis visualized.

Abdominal aorta: No aneurysm visualized.

Other findings: Varices are noted in the umbilical region. Minimal
ascites may be present.
IMPRESSION: Findings consistent with hepatic cirrhosis with associated
splenomegaly and varices and minimal ascites. Multiple lesions are
noted within the patent parenchyma, with the largest measuring
cm. These are enlarged compared to the prior exam and are concerning
for possible hepatocellular carcinoma or other malignancy.
Correlation with AFP levels is recommended as well as possible
follow-up MRI.
